# Patient Record
Sex: Female | Born: 1980 | Hispanic: Yes | Marital: Single | Smoking: Never smoker
Health system: Southern US, Community
[De-identification: ages and names within clinical notes are randomized; demographics above are authoritative.]

## PROBLEM LIST (undated history)

## (undated) DIAGNOSIS — K219 Gastro-esophageal reflux disease without esophagitis: Secondary | ICD-10-CM

## (undated) DIAGNOSIS — D649 Anemia, unspecified: Secondary | ICD-10-CM

## (undated) DIAGNOSIS — B9681 Helicobacter pylori [H. pylori] as the cause of diseases classified elsewhere: Secondary | ICD-10-CM

## (undated) DIAGNOSIS — K279 Peptic ulcer, site unspecified, unspecified as acute or chronic, without hemorrhage or perforation: Secondary | ICD-10-CM

## (undated) HISTORY — DX: Peptic ulcer, site unspecified, unspecified as acute or chronic, without hemorrhage or perforation: K27.9

## (undated) HISTORY — PX: TUBAL LIGATION: SHX77

## (undated) HISTORY — DX: Helicobacter pylori (H. pylori) as the cause of diseases classified elsewhere: B96.81

## (undated) HISTORY — PX: UPPER GI ENDOSCOPY: SHX6162

## (undated) HISTORY — DX: Gastro-esophageal reflux disease without esophagitis: K21.9

## (undated) HISTORY — DX: Anemia, unspecified: D64.9

---

## 2014-02-18 ENCOUNTER — Other Ambulatory Visit (HOSPITAL_COMMUNITY)
Admission: RE | Admit: 2014-02-18 | Discharge: 2014-02-18 | Disposition: A | Discharge status: Home / Self Care | Payer: BLUE CROSS/BLUE SHIELD | Source: Ambulatory Visit | Attending: Gynecology | Admitting: Gynecology

## 2014-02-18 ENCOUNTER — Encounter: Payer: Self-pay | Admitting: Gynecology

## 2014-02-18 ENCOUNTER — Ambulatory Visit (INDEPENDENT_AMBULATORY_CARE_PROVIDER_SITE_OTHER): Payer: BLUE CROSS/BLUE SHIELD | Admitting: Gynecology

## 2014-02-18 VITALS — BP 122/78 | Ht 63.0 in | Wt 159.0 lb

## 2014-02-18 DIAGNOSIS — Z8619 Personal history of other infectious and parasitic diseases: Secondary | ICD-10-CM

## 2014-02-18 DIAGNOSIS — Z1151 Encounter for screening for human papillomavirus (HPV): Secondary | ICD-10-CM | POA: Insufficient documentation

## 2014-02-18 DIAGNOSIS — Z01419 Encounter for gynecological examination (general) (routine) without abnormal findings: Secondary | ICD-10-CM | POA: Insufficient documentation

## 2014-02-18 DIAGNOSIS — K219 Gastro-esophageal reflux disease without esophagitis: Secondary | ICD-10-CM

## 2014-02-18 LAB — CBC WITH DIFFERENTIAL/PLATELET
Basophils Absolute: 0 10*3/uL (ref 0.0–0.1)
Basophils Relative: 1 % (ref 0–1)
EOS ABS: 0 10*3/uL (ref 0.0–0.7)
Eosinophils Relative: 1 % (ref 0–5)
HCT: 38.1 % (ref 36.0–46.0)
Hemoglobin: 12.4 g/dL (ref 12.0–15.0)
LYMPHS ABS: 2.3 10*3/uL (ref 0.7–4.0)
Lymphocytes Relative: 47 % — ABNORMAL HIGH (ref 12–46)
MCH: 26.2 pg (ref 26.0–34.0)
MCHC: 32.5 g/dL (ref 30.0–36.0)
MCV: 80.5 fL (ref 78.0–100.0)
MONO ABS: 0.4 10*3/uL (ref 0.1–1.0)
MONOS PCT: 9 % (ref 3–12)
MPV: 10.2 fL (ref 8.6–12.4)
NEUTROS ABS: 2 10*3/uL (ref 1.7–7.7)
NEUTROS PCT: 42 % — AB (ref 43–77)
PLATELETS: 242 10*3/uL (ref 150–400)
RBC: 4.73 MIL/uL (ref 3.87–5.11)
RDW: 13.9 % (ref 11.5–15.5)
WBC: 4.8 10*3/uL (ref 4.0–10.5)

## 2014-02-18 NOTE — Progress Notes (Signed)
Pamela Gibbs 22-Apr-1980 [id number]   History:    34 y.o.  for annual gyn exam who is a new patient to the practice. Patient stated that she delivered 6 months ago at [facility] vaginally at term and before discharge had postpartum tubal ligation. She is not a gravida 3 para 3. She breast-fed for 3 months in the last 3 months her cycles of been regular. Patient was otherwise asymptomatic but as much as I can get from her history she had positive GBS at time of her pregnancy and received antibiotic and also prior to her pregnancy she had been evaluated and appears she may have had H. pylori. She had been given medication for GERD but is asymptomatic and no longer taking it. Patient denies any prior history of any abnormal Pap smears. Patient is from [place], [place].  Past medical history,surgical history, family history and social history were all reviewed and documented in the EPIC chart.  Gynecologic History Patient's last menstrual period was 02/05/2014. Contraception: tubal ligation Last Pap: ?Marland Kitchen Results were: normal Last mammogram: Not indicated. Results were: Not indicated  Obstetric History OB History  Gravida Para Term Preterm AB SAB TAB Ectopic Multiple Living  # Outcome Date GA Lbr Len/2nd Weight Sex Delivery Anes PTL Lv  3 Para           2 Para           1 Para                ROS: A ROS was performed and pertinent positives and negatives are included in the history.  GENERAL: No fevers or chills. HEENT: No change in vision, no earache, sore throat or sinus congestion. NECK: No pain or stiffness. CARDIOVASCULAR: No chest pain or pressure. No palpitations. PULMONARY: No shortness of breath, cough or wheeze. GASTROINTESTINAL: No abdominal pain, nausea, vomiting or diarrhea, melena or bright red blood per rectum. GENITOURINARY: No urinary frequency, urgency, hesitancy or dysuria. MUSCULOSKELETAL: No joint or muscle pain, no back pain, no recent trauma.  DERMATOLOGIC: No rash, no itching, no lesions. ENDOCRINE: No polyuria, polydipsia, no heat or cold intolerance. No recent change in weight. HEMATOLOGICAL: No anemia or easy bruising or bleeding. NEUROLOGIC: No headache, seizures, numbness, tingling or weakness. PSYCHIATRIC: No depression, no loss of interest in normal activity or change in sleep pattern.     Exam: chaperone present  BP 122/78 mmHg  Ht  (1.6 m)  Wt 159 lb (72.122 kg)  BMI 28.17 kg/m2  LMP 02/05/2014  Body mass index is 28.17 kg/(m^2).  General appearance : Well developed well nourished female. No acute distress HEENT: Neck supple, trachea midline, no carotid bruits, no thyroidmegaly Lungs: Clear to auscultation, no rhonchi or wheezes, or rib retractions  Heart: Regular rate and rhythm, no murmurs or gallops Breast:Examined in sitting and supine position were symmetrical in appearance, no palpable masses or tenderness,  no skin retraction, no nipple inversion, no nipple discharge, no skin discoloration, no axillary or supraclavicular lymphadenopathy Abdomen: no palpable masses or tenderness, no rebound or guarding Extremities: no edema or skin discoloration or tenderness  Pelvic:  Bartholin, Urethra, Skene Glands: Within normal limits             Vagina: No gross lesions or discharge  Cervix: No gross lesions or discharge  Uterus  anteverted, normal size, shape and consistency, non-tender and mobile  Adnexa  Without masses  or tenderness  Anus and perineum  normal   Rectovaginal  normal sphincter tone without palpated masses or tenderness             Hemoccult not indicated     Assessment/Plan:  34 y.o. female for annual exam who did not show up for her six-week postpartum visit after [facility] [place]. Pap smear was done today. The following labs were ordered: CBC, conference metabolic panel, screening cholesterol, and urinalysis. Patient was reminded  on the importance of monthly breast exam. Patient stated that she had her flu vaccine within the past year.   Ok EdwardsFERNANDEZ,Ardith Test H MD, 2:54 PM 02/18/2014

## 2014-02-19 LAB — URINALYSIS W MICROSCOPIC + REFLEX CULTURE
Bacteria, UA: NONE SEEN
Bilirubin Urine: NEGATIVE
CRYSTALS: NONE SEEN
Casts: NONE SEEN
GLUCOSE, UA: NEGATIVE mg/dL
HGB URINE DIPSTICK: NEGATIVE
Ketones, ur: NEGATIVE mg/dL
Leukocytes, UA: NEGATIVE
Nitrite: NEGATIVE
PROTEIN: NEGATIVE mg/dL
Specific Gravity, Urine: 1.021 (ref 1.005–1.030)
Urobilinogen, UA: 0.2 mg/dL (ref 0.0–1.0)
pH: 5.5 (ref 5.0–8.0)

## 2014-02-19 LAB — CHOLESTEROL, TOTAL: Cholesterol: 201 mg/dL — ABNORMAL HIGH (ref 0–200)

## 2014-02-19 LAB — COMPREHENSIVE METABOLIC PANEL WITH GFR
ALT: 11 U/L (ref 0–35)
AST: 14 U/L (ref 0–37)
Albumin: 4 g/dL (ref 3.5–5.2)
Alkaline Phosphatase: 80 U/L (ref 39–117)
BUN: 8 mg/dL (ref 6–23)
CO2: 25 meq/L (ref 19–32)
Calcium: 9.1 mg/dL (ref 8.4–10.5)
Chloride: 103 meq/L (ref 96–112)
Creat: 0.41 mg/dL — ABNORMAL LOW (ref 0.50–1.10)
Glucose, Bld: 89 mg/dL (ref 70–99)
Potassium: 3.8 meq/L (ref 3.5–5.3)
Sodium: 138 meq/L (ref 135–145)
Total Bilirubin: 0.6 mg/dL (ref 0.2–1.2)
Total Protein: 6.8 g/dL (ref 6.0–8.3)

## 2014-02-22 LAB — CYTOLOGY - PAP

## 2014-03-03 ENCOUNTER — Telehealth: Payer: Self-pay | Admitting: *Deleted

## 2014-03-03 NOTE — Telephone Encounter (Signed)
Please reassure patient that these are normal variant reference markers. No additional testing is required

## 2014-03-03 NOTE — Telephone Encounter (Signed)
Pamela Gibbs will inform patient. 

## 2014-03-03 NOTE — Telephone Encounter (Signed)
Pt saw lab results on my chart and called Claudia with questions about the abnormal (yellow flags) on her results. Pt asked if she should be worried about anything? Please advise

## 2014-07-22 ENCOUNTER — Encounter: Payer: Self-pay | Admitting: Gynecology

## 2014-07-22 ENCOUNTER — Ambulatory Visit (INDEPENDENT_AMBULATORY_CARE_PROVIDER_SITE_OTHER): Payer: BLUE CROSS/BLUE SHIELD | Admitting: Gynecology

## 2014-07-22 ENCOUNTER — Telehealth: Payer: Self-pay | Admitting: *Deleted

## 2014-07-22 VITALS — BP 122/82

## 2014-07-22 DIAGNOSIS — R1013 Epigastric pain: Secondary | ICD-10-CM

## 2014-07-22 DIAGNOSIS — Z8619 Personal history of other infectious and parasitic diseases: Secondary | ICD-10-CM | POA: Diagnosis not present

## 2014-07-22 LAB — CBC WITH DIFFERENTIAL/PLATELET
BASOS ABS: 0 10*3/uL (ref 0.0–0.1)
Basophils Relative: 0 % (ref 0–1)
EOS ABS: 0.1 10*3/uL (ref 0.0–0.7)
Eosinophils Relative: 2 % (ref 0–5)
HCT: 36.9 % (ref 36.0–46.0)
HEMOGLOBIN: 12.1 g/dL (ref 12.0–15.0)
LYMPHS ABS: 2.9 10*3/uL (ref 0.7–4.0)
Lymphocytes Relative: 49 % — ABNORMAL HIGH (ref 12–46)
MCH: 25.5 pg — ABNORMAL LOW (ref 26.0–34.0)
MCHC: 32.8 g/dL (ref 30.0–36.0)
MCV: 77.8 fL — ABNORMAL LOW (ref 78.0–100.0)
MONOS PCT: 10 % (ref 3–12)
MPV: 10.2 fL (ref 8.6–12.4)
Monocytes Absolute: 0.6 10*3/uL (ref 0.1–1.0)
NEUTROS ABS: 2.3 10*3/uL (ref 1.7–7.7)
NEUTROS PCT: 39 % — AB (ref 43–77)
Platelets: 263 10*3/uL (ref 150–400)
RBC: 4.74 MIL/uL (ref 3.87–5.11)
RDW: 15.3 % (ref 11.5–15.5)
WBC: 5.9 10*3/uL (ref 4.0–10.5)

## 2014-07-22 LAB — COMPREHENSIVE METABOLIC PANEL
ALBUMIN: 4 g/dL (ref 3.5–5.2)
ALK PHOS: 75 U/L (ref 39–117)
ALT: 12 U/L (ref 0–35)
AST: 14 U/L (ref 0–37)
BUN: 11 mg/dL (ref 6–23)
CO2: 25 meq/L (ref 19–32)
Calcium: 9.1 mg/dL (ref 8.4–10.5)
Chloride: 102 mEq/L (ref 96–112)
Creat: 0.73 mg/dL (ref 0.50–1.10)
Glucose, Bld: 82 mg/dL (ref 70–99)
POTASSIUM: 3.8 meq/L (ref 3.5–5.3)
SODIUM: 136 meq/L (ref 135–145)
TOTAL PROTEIN: 6.9 g/dL (ref 6.0–8.3)
Total Bilirubin: 0.5 mg/dL (ref 0.2–1.2)

## 2014-07-22 NOTE — Telephone Encounter (Signed)
Appointment on 07/29/14 @ 2:00pm at [facility] hospital, NPO 6 hours prior to ultrasound. Debarah Crape will relay to patient

## 2014-07-22 NOTE — Telephone Encounter (Signed)
-----   Message from Ok Edwards, MD sent at 07/22/2014  2:57 PM EDT ----- Victorino Dike, please schedule this patient for an upper abdominal ultrasound. Patient with midepigastric pains rule out cholelithiasis. History of H. pylori in the past.

## 2014-07-22 NOTE — Patient Instructions (Signed)
lcera pptica  (Peptic Ulcer)  La lcera pptica es una llaga dolorosa en la membrana que recubre el esfago (lcera esofgica), el estmago (lcera gstrica), o la primera parte del intestino delgado (lcera duodenal). La lcera causa erosin en los tejidos profundos.  CAUSAS  Normalmente, el revestimiento del estmago y del intestino delgado se protegen a s mismos del cido con que se digieren los alimentos. El revestimiento protector puede daarse debido a:   Una infeccin causada por una bacteria llamada Helicobacter pylori (H. pylori).  El uso regular de medicamentos anti-inflamatorios no esteroides (AINE), como el ibuprofeno o la aspirina.  El consumo de tabaco. Otros factores de riesgo incluyen ser mayor de 50 aos, el consumo de alcohol en exceso y Wilburt Finlay antecedentes familiares de lcera.  SNTOMAS   Dolor quemante o punzante en la zona entre el pecho y el ombligo.  Acidez.  Nuseas y vmitos.  Hinchazn. El dolor empeora con el estmago vaco y por la noche. Si la lcera sangra, puede causar:   Materia fecal de color negro alquitranado.  Vmito de sangre roja brillante.  Vmito de aspecto similar a la borra del caf. DIAGNSTICO  El diagnstico se realiza basndose en la historia clnica y el examen fsico. Para encontrar las causas de la lcera podrn indicarle otros estudios y procedimientos. Encontrar la causa ayudar a Futures trader. Los estudios y procedimientos pueden incluir:   Anlisis de sangre, anlisis de materia fecal, o estudios del aliento para Engineer, manufacturing la bacteria H. pylori.  Una seriada del tracto gastrointestinal (GI) del esfago, el estmago y el intestino delgado.  Una endoscopia para examinar el esfago, el estmago y el intestino delgado.  Una biopsia. TRATAMIENTO  El tratamiento incluye:   La eliminacin de la causa de la lcera, como el tabaquismo, los [place] o el alcohol.  Medicamentos para reducir la cantidad de cido en  el tracto digestivo.  Antibiticos si la causa de la lcera es la bacteria H. pylori.  Una endoscopia superior para tratar Rolan Lipa sangrante.  Ciruga si el sangrado es grave o si la lcera ha perforado Event organiser en el sistema digestivo. INSTRUCCIONES PARA EL CUIDADO EN EL HOGAR   Evite el tabaco, el alcohol y la cafena. El fumar puede aumentar el cido en el estmago y el tabaquismo continuado no favorecer la curacin de las lceras.  Evite los alimentos y las bebidas que le parece que le causan molestias o que le agravan su lcera.  Tome slo la medicacin que le indic el profesional. No tome sustitutos de venta libre de los medicamentos recetados sin Science writer a su mdico.  Cumpla con las consultas de control y hgase los estudios segn las indicaciones. SOLICITE ATENCIN MDICA SI:   La infeccin no mejora dentro de los [address]  [address] despus de Programmer, systems.  Siente indigestin o [place] continua. SOLICITE ATENCIN MDICA DE INMEDIATO SI:   Siente un dolor repentino y agudo o persistente en el abdomen.  La materia fecal es sanguinolenta o negra, de aspecto alquitranado.  Vomita sangre o el vmito tiene el aspecto similar a la borra del caf.  Si se siente mareado, dbil o que va a desmayarse.  Se siente transpirado o sudoroso. ASEGRESE DE QUE:   Comprende estas instrucciones.  Controlar su enfermedad.  Solicitar ayuda de inmediato si no mejora o si empeora. Document Released: 11/01/2004 Document Revised: 10/17/2011 [facility] Patient Information 2015 [place], [place]. This information is not intended to replace advice given to you by  your health care provider. Make sure you discuss any questions you have with your health care provider. Anlisis de anticuerpos a Helicobacter Pylori (Helicobacter Pylori Antibodies Test) Se trata de un anlisis de sangre que busca la presencia de una bacteria llamada Helicobacter Pylori. Esto puede diagnosticarse  tambin mediante una prueba de [place] o un examen microscpico de una parte (biopsia) del intestino delgado. H. pylori es una bacteria que se encuentra en las clulas que recubren el [place]. Es un factor de riesgo para las Educational psychologist y en el intestino delgado, inflamacin prolongada del recubrimiento del [place], o incluso lceras que pueden ocurrir en el esfago (la va que va desde la boca al [place]). Esta bacteria es tambin un factor de riesgo de cncer de estmago. La bacteria que se encuentra en, aproximadamente, el 10% de las personas sanas menores de 30 aos de edad, y la cantidad de bacteria aumenta con la edad. La Harley[employer]Davidson de las personas que tienen esta bacteria no experimentan sntomas; no obstante, se considera que, cuando esta bacteria ocasiona lceras, se pueden usar antibiticos para eliminar o reducir el problema.  PREPARACIN PARA EL ESTUDIO No se requiere una preparacin especial ni ayunar. HALLAZGOS NORMALES Negativo (no se detect la presencia de la bacteria H-pylori) Los rangos para los resultados normales pueden variar entre diferentes laboratorios y hospitales. Consulte siempre con su mdico despus de Production assistant, radio estudio para Artist significado de los [place] y si los valores se consideran "dentro de los lmites normales". SIGNIFICADO DEL ESTUDIO  El mdico leer los resultados y Heritage manager con usted sobre la importancia y el significado de los [place], as como las opciones de tratamiento y la necesidad de Education officer, environmental pruebas adicionales, si fuera necesario. Es su responsabilidad retirar el resultado del [place]. Consulte en el laboratorio cundo y cmo podr [employer]. OBTENCIN DE LOS RESULTADOS DE LAS PRUEBAS Es su responsabilidad retirar el resultado del [place]. Consulte en el laboratorio cundo y cmo podr [employer]. Document Released: 11/12/2012 [facility] Patient Information 2015 [place], [place]. This information is not  intended to replace advice given to you by your health care provider. Make sure you discuss any questions you have with your health care provider. Colelitiasis (Cholelithiasis) La colelitiasis (tambin llamada clculos en la vescula) es una enfermedad de la vescula. La vescula es un rgano pequeo que ayuda a digerir las [place]. Los sntomas de clculos en la vescula son:  Caleen Essex de vomitar (nuseas).  Devolver la comida (vomitar).  Dolor en el vientre.  Piel amarilla (ictericia)  Dolor sbito. Podr sentir Chief Technology Officer durante algunos minutos o unas horas.  Grant Ruts.  Dolor a Insurance claims handler. CUIDADOS EN EL HOGAR  Tome slo los medicamentos segn le haya indicado el mdico.  Siga una dieta baja en grasas hasta que vuelva a ver al mdico nuevamente. Consumir grasas puede Programmer, multimedia.  Concurra a las consultas de control con el mdico, segn las indicaciones. Los ataques generalmente ocurren repetidas veces. Generalmente se requiere de [employer]. SOLICITE AYUDA DE INMEDIATO SI:   El dolor empeora.  El dolor no se alivia con los [employer].  Tiene fiebre o sntomas que persisten durante ms de 2-3 das.  Tiene fiebre y los sntomas empeoran repentinamente.  Siente nuseas y vomita. ASEGRESE DE QUE:   Comprende estas instrucciones.  Controlar su afeccin.  Recibir ayuda de inmediato si no mejora o si empeora. Document Released: 04/20/2008 Document Revised: 09/24/2012 [facility] Patient Information 2015 [place], [place]. This information is  not intended to replace advice given to you by your health care provider. Make sure you discuss any questions you have with your health care provider.

## 2014-07-22 NOTE — Progress Notes (Signed)
   Patient is a 34 year old who was seen in the office was the first time as a new patient J your this year. Patient had a vaginal delivery in 2015 at [facility] and subsequently had postpartum tubal ligation. She reports normal menstrual cycles. The reason for her presentation to the office is that she is experiencing right upper quadrant discomfort for the past month. She feels bloated. Also when she eats food her symptoms return especially if they're high in fat intake. There is a question whether she was treated with an upper endoscopy and with been diagnosed with H. pylori last year. There is description that she has had history of GERD in the past. Patient denies any change in her stool color or urine. Or any yellowing of her eyes.  Exam: Blood pressure 122/82, sclera with no evidence of icterus  Gen. appearance well-developed well-nourished female in no acute distress Abdomen tenderness in the mid epigastric portion/positive Murphy sign Pelvic: Bartholin urethra Skene was within normal limits vagina: No lesions or discharge Cervix: No lesions or discharge Uterus anteverted normal size shape and consistency Adnexa: No palpable mass or tenderness Rectal exam not done  Assessment/plan: Patient with right upper quadrant pains manifested by foods especially high in fact confident. Highly suspicious of cholelithiasis. Patient will be scheduled for an upper abdominal ultrasound with the next few days. A comprehensive metabolic panel along with CBC. Was ordered also in H. pylori breath test to see if she's having an exacerbation of her H. pylori based on her history and symptomatology. Depending on these tests we'll refer to the gastroenterologist and she has seen in the past uterine [place] or refer to her local gastroenterologist. Instructions provided in written format in Spanish. Interpreter present as well.

## 2014-07-22 NOTE — Addendum Note (Signed)
Addended by: Richardson Chiquito on: 07/22/2014 03:37 PM   Modules accepted: Orders

## 2014-07-23 ENCOUNTER — Telehealth: Payer: Self-pay

## 2014-07-23 ENCOUNTER — Encounter: Payer: Self-pay | Admitting: Gynecology

## 2014-07-23 ENCOUNTER — Other Ambulatory Visit: Payer: Self-pay | Admitting: Gynecology

## 2014-07-23 LAB — H. PYLORI BREATH TEST: H. pylori Breath Test: DETECTED — AB

## 2014-07-23 MED ORDER — TETRACYCLINE HCL 500 MG PO CAPS: 500.0000 mg | ORAL_CAPSULE | Freq: Four times a day (QID) | ORAL | Status: DC

## 2014-07-23 MED ORDER — METRONIDAZOLE 250 MG PO TABS: 250.0000 mg | ORAL_TABLET | Freq: Four times a day (QID) | ORAL | Status: DC

## 2014-07-23 MED ORDER — BISMUTH SUBSALICYLATE 262 MG PO TABS: 524.0000 mg | ORAL_TABLET | Freq: Four times a day (QID) | ORAL | Status: DC

## 2014-07-23 MED ORDER — OMEPRAZOLE 20 MG PO CPDR: 20.0000 mg | DELAYED_RELEASE_CAPSULE | Freq: Two times a day (BID) | ORAL | Status: DC

## 2014-07-23 NOTE — Telephone Encounter (Signed)
Patient called regarding lab results from yesterday.  Advised blood work results are okay and H. Pylori test is pending.  It did appear you had reviewed the labs. Ok to have told her that they looked okay?

## 2014-07-23 NOTE — Telephone Encounter (Signed)
Pamela Gibbs spoke with patient and informed her of Labs and H. Pylori results and needs for medications. I sent Rx's for all four meds to her pharmacy.

## 2014-07-23 NOTE — Telephone Encounter (Signed)
That is correct H. pylori an upper abdominal ultrasound results I do not have

## 2014-07-26 ENCOUNTER — Telehealth: Payer: Self-pay | Admitting: Anesthesiology

## 2014-07-26 ENCOUNTER — Telehealth: Payer: Self-pay | Admitting: *Deleted

## 2014-07-26 NOTE — Telephone Encounter (Signed)
It is probably from medication. Make sure that she follows up with her gastroenterologist that she has seen before after she completes the treatment

## 2014-07-26 NOTE — Telephone Encounter (Signed)
SEE note from [place] same date

## 2014-07-26 NOTE — Telephone Encounter (Signed)
Pamela Gibbs spoke with patient and asked me to relay to you this. pt [name] test was positive is taking prescribed medication, states pt has noticed black like substance when using the bathroom? Please advise

## 2014-07-27 NOTE — Telephone Encounter (Signed)
Will relay to [place]

## 2014-07-29 ENCOUNTER — Ambulatory Visit (HOSPITAL_COMMUNITY)
Admission: RE | Admit: 2014-07-29 | Discharge: 2014-07-29 | Disposition: A | Discharge status: Home / Self Care | Payer: BLUE CROSS/BLUE SHIELD | Source: Ambulatory Visit | Attending: Gynecology | Admitting: Gynecology

## 2014-07-29 DIAGNOSIS — K7689 Other specified diseases of liver: Secondary | ICD-10-CM | POA: Insufficient documentation

## 2014-07-29 DIAGNOSIS — I77811 Abdominal aortic ectasia: Secondary | ICD-10-CM | POA: Diagnosis not present

## 2014-07-29 DIAGNOSIS — R1013 Epigastric pain: Secondary | ICD-10-CM | POA: Diagnosis present

## 2014-08-02 ENCOUNTER — Telehealth: Payer: Self-pay | Admitting: *Deleted

## 2014-08-02 NOTE — Telephone Encounter (Signed)
Referral form and notes faxed to [facility] [place] they will contact pt to schedule.

## 2014-08-02 NOTE — Telephone Encounter (Signed)
-----   Message from Keenan BachelorKatherine R Annas, [place]MA sent at 07/30/2014 11:48 AM EDT ----- Regarding: referral Victorino DikeJennifer please inform patient that her abdominal ultrasound was normal with the exception that her Aorta is slightly dilated and needs further evaluation. Please make an appointment with anyone in the [facility] that Dr. Tawanna Coolerodd Early is in. 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 She speaks no [place]. Claudia or [place]comb[place]anca may need to helpmyou

## 2014-08-03 NOTE — Telephone Encounter (Signed)
Appointment on 09/14/14 @ 10:00 am with Dr.Todd Early will have blance relay to patient.

## 2014-08-04 ENCOUNTER — Encounter: Payer: Self-pay | Admitting: Vascular Surgery

## 2014-09-14 ENCOUNTER — Encounter: Payer: BLUE CROSS/BLUE SHIELD | Admitting: Vascular Surgery

## 2014-09-20 ENCOUNTER — Encounter: Payer: Self-pay | Admitting: Vascular Surgery

## 2014-09-21 ENCOUNTER — Encounter: Payer: BLUE CROSS/BLUE SHIELD | Admitting: Vascular Surgery

## 2014-09-23 ENCOUNTER — Other Ambulatory Visit: Payer: Self-pay | Admitting: Gynecology

## 2014-12-01 ENCOUNTER — Encounter: Payer: Self-pay | Admitting: Gynecology

## 2014-12-01 ENCOUNTER — Ambulatory Visit (INDEPENDENT_AMBULATORY_CARE_PROVIDER_SITE_OTHER): Payer: BLUE CROSS/BLUE SHIELD | Admitting: Gynecology

## 2014-12-01 VITALS — BP 126/78

## 2014-12-01 DIAGNOSIS — N819 Female genital prolapse, unspecified: Secondary | ICD-10-CM

## 2014-12-01 DIAGNOSIS — R102 Pelvic and perineal pain: Secondary | ICD-10-CM

## 2014-12-01 DIAGNOSIS — Z8619 Personal history of other infectious and parasitic diseases: Secondary | ICD-10-CM | POA: Diagnosis not present

## 2014-12-01 NOTE — Progress Notes (Signed)
   Patient is a 34 year ol69d gravida 3 para 3 that presented to the office today complaining of a bulging sensation at her vagina. She denies any true urinary incontinence but if she has to urinate she has to rush to the bathroom. She denies any fever, chills, nausea, or vomiting. She denies any GI problems. Patient states that her 3 children were delivered vaginally largest one measuring 9 pounds. The past 3 months her symptoms became more evident since she has been doing a lot of lifting at work. In June of this year due to patient's midepigastric pains and H. pylori breath test was done which was positive. She was treated accordingly and had been referred to the gastroenterologist. She had been on metronidazole, omeprazole result, and tetracycline as part of her treatment regimen. She brought a copy of the report from [place] August of this year and the pathology report was that the lining of the stomach only inflammation no signs of bacterial infection. She was instructed to refrain from NSAIDs.  Exam: Well developed well nourished female with above-mentioned complaint Patient was examined in the supine and erect position. Pelvic: Bartholin urethra Skene was within normal limits Vagina: No cystocele rectocele noted on Valsalva maneuver her cervix came to the area of the introitus. A Q-tip angle test was less than 30 at the urethrovesical angle.  Patient was examined and erect position uterine descensus was noted whereby the cervix was close to the opening of the vagina. There was no cystocele rectocele patient did not allow me to do a rectal exam to rule out any enterocele.  Assessment/plan: Patient with pelvic organ prolapse no cystocele, rectocele, no evidence of stress urinary incontinence. Patient was shown pictures and pamphlets provided in reference to pelvic organ prolapse. We discussed treatment options to include transvaginal hysterectomy or  temporary relief with pessary. Literature information was also presented in Spanish patient will review it and decide which route she would like to proceed and return back to see me in 2 weeks.

## 2014-12-01 NOTE — Patient Instructions (Signed)
Informacin sobre Training and development officer (Hysterectomy Information)  La histerectoma es una ciruga que se realiza para extirpar el tero. Esta ciruga se puede realizar para tratar varios problemas mdicos. Despus de la ciruga, no volver a Personal assistant. Adems, debido a esta ciruga no podr quedar embarazada (estril). Asimismo, durante esta ciruga se podrn extirpar las trompas de falopio y los ovarios (salpingooforectoma bilateral).  RAZONES PARA REALIZAR UNA HISTERECTOMA  Sangrado persistente, anormal.  Dolor o infeccin persistente (crnico) en la pelvis.  El endometrio comienza a desarrollarse fuera del tero (endometriosis).  El endometrio comienza a desarrollarse en el msculo del tero (adenomiosis).  El tero desciende hacia la vagina (prolapso de los rganos de la pelvis).  Neoplasias benignas en el tero (fibromas uterinos) que provocan sntomas.  Clulas precancerosas.  Cncer cervical o cncer uterino. TIPOS DE HISTERECTOMA  Histerectoma supracervical: en este tipo de intervencin, se extirpa la parte superior del tero, pero no el cuello del tero.  Histerectoma total: se extirpan el tero y el cuello del tero.  Histerectoma radical: se extirpan el tero, el cuello del tero y los tejidos fibrosos que sostienen al tero en su lugar en la pelvis (parametrio). FORMAS EN QUE SE PUEDE REALIZAR UNA HISTERECTOMA  Histerectoma abdominal: se realiza un corte quirrgico grande (incisin) en el abdomen. Se extirpa el tero a travs de esta incisin.  Histerectoma vaginal: se realiza una incisin en la vagina. Se extirpa el tero a travs de esta incisin. No hay incisiones abdominales.  Histerectoma laparoscpica convencional: se realizan tres o cuatro incisiones pequeas en el abdomen. Se inserta un tubo delgado y luminoso con una cmara (laparoscopio) en una de las incisiones. A travs del resto de las incisiones se insertan otros instrumentos  quirrgicos. El tero se corta en trozos pequeos. Las piezas pequeas se eliminan a travs de las incisiones, o se retiran a travs de la vagina.  Histerectoma vaginal asistida por laparoscopia (HVAL): se realizan tres o cuatro incisiones pequeas en el abdomen. Parte de la ciruga se realiza por va laparoscpica y parte por va vaginal. El tero se extirpa a travs de la vagina.  Histerectoma laparoscpica asistida por robot: se insertan un laparoscopio y otros instrumentos quirrgicos en 3 o 4 incisiones pequeas en el abdomen. Se utiliza un dispositivo controlado por computadora para que el cirujano vea una imagen en 3D que lo ayuda a Chief Technology Officer los instrumentos quirrgicos. Esto permite movimientos ms precisos de los instrumentos quirrgicos. El tero se corta en trozos pequeos y se retira a travs de las incisiones o se elimina a travs de la vagina. RIESGOS Y [place] complicaciones asociadas con este procedimiento incluyen las siguientes:  Hemorragia y riesgos de Designer, industrial/product una transfusin sangunea. Infrmele al mdico si no quiere recibir ningn tipo de hemoderivados.  Cogulos de [employer] piernas o los pulmones.  Infeccin.  Lesin en los rganos circundantes.  Problemas o efectos secundarios relacionados con la anestesia.  Transformacin de cualquiera de las otras tcnicas en una histerectoma abdominal. QU ESPERAR DESPUES DE UNA HISTERECTOMA  Le darn medicamentos para el dolor.  Necesitar que alguien Nature conservation officer con usted durante los primeros 3 a 5 das despus de que regrese a Medical illustrator.  Tendr que ver al cirujano para un seguimiento 2 a 4 semanas despus de la ciruga para evaluar su progreso.  Posiblemente tenga sntomas tempranos de menopausia, como sofocos, sudoracin nocturna e insomnio.  Si le han realizado una histerectoma debido a un problema que no era cncer ni Ardelia Mems  afeccin que poda causar cncer, ya no necesitar una prueba de  Papanicolaou. Sin embargo, si ya no necesita hacerse un Papanicolau, es una buena idea hacerse un examen regularmente para asegurarse de que no hay otros problemas.   Esta informacin no tiene Theme park managercomo fin reemplazar el consejo del mdico. Asegrese de hacerle al mdico cualquier pregunta que tenga.   Document Released: 01/22/2005 Document Revised: 11/12/2012 Elsevier Interactive Patient Education 2016 [employer] Prolapso de los rganos de la pelvis (Pelvic Organ Prolapse) El prolapso de los rganos de la pelvis es el estiramiento, la dilatacin o la cada de los rganos de la pelvis a una posicin anormal. Esto sucede cuando los msculos y tejidos que rodean y sostienen las estructuras plvicas se estiran o se debilitan. El prolapso de los rganos de la pelvis puede involucrar los siguientes rganos:  Vagina (prolapso vaginal).  tero (prolapso uterino).  Vejiga (cistocele).  Recto (rectocele).  Intestinos (enterocele). Cuando estn comprometidos rganos que no son la vagina, estos a menudo se protruyen hacia adentro o hacia afuera de la vagina, dependiendo de la gravedad del prolapso. CAUSAS Algunas causas de esta enfermedad incluyen lo siguiente:  Krystal Clarkmbarazo, trabajo de parto y [place].  Tos prolongada (crnica).  Estreimiento crnico.  Obesidad.  Ciruga de pelvis anterior.  Envejecimiento. Durante y despus de la menopausia, una disminucin en la produccin de Cabin crewestrgenos puede debilitar los msculos y los ligamentos plvicos.  Levantar sistemticamente objetos pesados de ms de 50libras (23kg).  Acumulacin de lquido en el abdomen debido a ciertas enfermedades y a Lexicographerotras afecciones. SNTOMAS Los sntomas de esta afeccin incluyen lo siguiente:  Prdida del control de la vejiga al toser, Engineering geologistestornudar, hacer un esfuerzo y Radio producerhacer ejercicio (incontinencia urinaria de esfuerzo). Esto puede empeorar inmediatamente despus del nacimiento y Scientist, clinical (histocompatibility and immunogenetics)mejorar gradualmente con Diplomatic Services operational officerel  tiempo.  Sensacin de presin en la pelvis o la vagina. Esta presin puede aumentar al toser o defecar.  Una protuberancia que sobresale desde la apertura de la vagina o contra la pared vaginal. Si el tero sobresale a travs de la apertura de la vagina y roza la ropa, tambin puede padecer irritacin, lceras, infeccin, dolor y Sipseysangrado.  Mayor esfuerzo para Magazine features editordefecar u orinar.  Dolor en la parte inferior de la espalda.  Dolor, molestias o Diplomatic Services operational officerdesinters en las relaciones sexuales.  Infecciones reiteradas en la vejiga (infecciones en las vas urinarias).  Dificultad o incapacidad para colocar un tampn o un aplicador. En algunas personas, esta afeccin no produce sntomas. DIAGNSTICO El mdico puede realizarle un examen interno y externo de la vagina y el recto. Durante el examen, puede pedirle que tosa y que haga un esfuerzo mientras est [place]enzburgacostada, sentada y de pie. El mdico determinar si se requieren ms estudios, como las pruebas de la funcin de la vejiga. TRATAMIENTO En la International Business Machinesmayora de los casos, esta afeccin debe tratarse solo si produce sntomas. No existe ningn tratamiento que garantice que el prolapso se corregir o que alivie los sntomas por completo. El tratamiento puede incluir lo siguiente:  Cambios de estilo de vida tales como:  Automotive engineervitar las bebidas que contengan cafena.  Aumentar la ingesta de alimentos con alto contenido de [place]fibra. Esto puede ayudar a Engineer, manufacturing systemsdisminuir el estreimiento y Psychologist, educationalel esfuerzo al defecar.  Vaciar la vejiga en momentos programados (terapia de entrenamiento de la vejiga). Esto puede ayudar a reducir o Transport plannerevitar la incontinencia urinaria.  Bajar de peso si tiene sobrepeso o es obeso.  Estrgeno. Si el prolapso es leve, el estrgeno puede ayudar al aumentar la fuerza  y el tono de los msculos del piso plvico.  Ejercicios de Kegel. Estos ejercicios pueden ayudar en los casos leves de prolapso al estirar y tensar los msculos del piso plvico.  Colocacin de un  pesario. Un pesario es un dispositivo blando y flexible que el mdico coloca en la vagina para ayudar a Occupational psychologist las paredes vaginales y Pharmacologist los rganos de la pelvis en su Environmental consultant.  Ciruga. Esta es a menudo la nica forma de tratamiento de los casos graves de prolapso. Existen diferentes tipos de [place] disponibles. INSTRUCCIONES PARA EL CUIDADO EN EL HOGAR  Use una toalla higinica o un producto absorbente si tiene incontinencia urinaria.  Evite levantar objetos pesados y Comptroller se ejercita o trabaja. No contenga la respiracin cuando levanta pesas y realiza ejercicios de intensidad leve a moderada. Limite sus actividades segn las indicaciones del mdico.  Tome los medicamentos solamente como se lo haya indicado el mdico.  Practique los ejercicios de Kegel como se lo haya indicado el mdico.  Si tiene un pesario, selo como se lo haya indicado el mdico. SOLICITE ATENCIN MDICA SI:  Sus sntomas interfieren con sus actividades diarias o su vida sexual.  Necesita medicamentos para Paramedic las [place].  Observa sangrado vaginal no relacionado con su menstruacin.  Tiene fiebre.  Siente dolor o tiene una hemorragia al [employer].  Observa sangrado al defecar.  Pierde orina durante las [employer].  Sufre estreimiento crnico.  Tiene un pesario colocado y Associate Professor.  Tiene secrecin vaginal con olor ftido.  Tiene dolor o clicos inusuales en la parte inferior del abdomen.   Esta informacin no tiene Theme park manager el consejo del mdico. Asegrese de hacerle al mdico cualquier pregunta que tenga.   Document Released: 08/19/2013 Elsevier Interactive Patient Education [employer]

## 2014-12-14 ENCOUNTER — Institutional Professional Consult (permissible substitution): Payer: BLUE CROSS/BLUE SHIELD | Admitting: Gynecology

## 2015-03-18 ENCOUNTER — Encounter: Payer: BLUE CROSS/BLUE SHIELD | Admitting: Gynecology

## 2016-04-11 ENCOUNTER — Encounter: Payer: Self-pay | Admitting: Gynecology

## 2016-04-11 ENCOUNTER — Ambulatory Visit (INDEPENDENT_AMBULATORY_CARE_PROVIDER_SITE_OTHER): Payer: BLUE CROSS/BLUE SHIELD | Admitting: Gynecology

## 2016-04-11 VITALS — BP 120/78 | Ht 63.0 in | Wt 150.0 lb

## 2016-04-11 DIAGNOSIS — Z01411 Encounter for gynecological examination (general) (routine) with abnormal findings: Secondary | ICD-10-CM

## 2016-04-11 DIAGNOSIS — N814 Uterovaginal prolapse, unspecified: Secondary | ICD-10-CM

## 2016-04-11 LAB — CBC WITH DIFFERENTIAL/PLATELET
Basophils Absolute: 0 cells/uL (ref 0–200)
Basophils Relative: 0 %
EOS PCT: 1 %
Eosinophils Absolute: 55 cells/uL (ref 15–500)
HCT: 35 % (ref 35.0–45.0)
Hemoglobin: 10.9 g/dL — ABNORMAL LOW (ref 11.7–15.5)
Lymphocytes Relative: 37 %
Lymphs Abs: 2035 cells/uL (ref 850–3900)
MCH: 23.6 pg — ABNORMAL LOW (ref 27.0–33.0)
MCHC: 31.1 g/dL — AB (ref 32.0–36.0)
MCV: 75.9 fL — ABNORMAL LOW (ref 80.0–100.0)
MPV: 10.8 fL (ref 7.5–12.5)
Monocytes Absolute: 550 cells/uL (ref 200–950)
Monocytes Relative: 10 %
NEUTROS PCT: 52 %
Neutro Abs: 2860 cells/uL (ref 1500–7800)
PLATELETS: 254 10*3/uL (ref 140–400)
RBC: 4.61 MIL/uL (ref 3.80–5.10)
RDW: 15.4 % — ABNORMAL HIGH (ref 11.0–15.0)
WBC: 5.5 10*3/uL (ref 3.8–10.8)

## 2016-04-11 LAB — COMPREHENSIVE METABOLIC PANEL
ALT: 9 U/L (ref 6–29)
AST: 16 U/L (ref 10–30)
Albumin: 4 g/dL (ref 3.6–5.1)
Alkaline Phosphatase: 72 U/L (ref 33–115)
BUN: 9 mg/dL (ref 7–25)
CO2: 22 mmol/L (ref 20–31)
Calcium: 9 mg/dL (ref 8.6–10.2)
Chloride: 103 mmol/L (ref 98–110)
Creat: 0.51 mg/dL (ref 0.50–1.10)
Glucose, Bld: 89 mg/dL (ref 65–99)
Potassium: 3.7 mmol/L (ref 3.5–5.3)
Sodium: 139 mmol/L (ref 135–146)
TOTAL PROTEIN: 7.1 g/dL (ref 6.1–8.1)
Total Bilirubin: 0.7 mg/dL (ref 0.2–1.2)

## 2016-04-11 LAB — CHOLESTEROL, TOTAL: CHOLESTEROL: 191 mg/dL (ref <200)

## 2016-04-11 MED ORDER — OMEPRAZOLE 20 MG PO CPDR: 20.0000 mg | DELAYED_RELEASE_CAPSULE | Freq: Every day | ORAL | 4 refills | Status: AC

## 2016-04-11 NOTE — Patient Instructions (Addendum)
Gammagrafa de reflujo gastroesofgico (Gastroesophageal Reflux Scan) La gammagrafa de reflujo gastroesofgico es un procedimiento que se realiza para Landscape architect presencia de reflujo gastroesofgico, que es el flujo retrgrado de los contenidos estomacales hacia el conducto que lleva los alimentos desde la boca hasta el estmago (esfago). Adems, puede revelar si se produce la inhalacin (aspiracin) de estos contenidos [employer]. Tal vez deba realizarse esta gammagrafa si tiene sntomas, como acidez, vmitos, problemas de deglucin o regurgitacin. La regurgitacin ocurre cuando los alimentos ingeridos regresan desde el estmago al esfago. Para este estudio, beber un lquido que contiene una pequea cantidad de una sustancia radiactiva Radiographer, therapeutic). Se [place] un escner con Neomia Dear cmara que detecta el marcador radiactivo para determinar si el material regresa al esfago. INFORME A SU MDICO:  Cualquier alergia que tenga.  Todos los [employer], incluidos vitaminas, hierbas, gotas oftlmicas, cremas y [address] de [address].  Enfermedades de la sangre que tenga.  Si tiene cirugas previas.  Si tiene [employer].  Si est embarazada o cree que podra estarlo.  Si est amamantando. RIESGOS Y COMPLICACIONES En general, se trata de un procedimiento seguro. Sin embargo, pueden presentarse problemas, por ejemplo:  Exposicin a la radiacin (pequea cantidad).  Reaccin alrgica a la sustancia radiactiva. Esto es raro. ANTES DEL PROCEDIMIENTO  Consulte a su mdico si debe cambiar o suspender los medicamentos que toma habitualmente. Esto es muy importante si toma medicamentos para la diabetes o anticoagulantes.  Siga las indicaciones del mdico respecto de las restricciones para las comidas o las bebidas. PROCEDIMIENTO  Se le indicar que beba un lquido que contiene una pequea cantidad de un marcador radiactivo. Es probable que este lquido sea parecido al  jugo de naranjas.  Se acostar boca arriba.  Le tomarn una serie de imgenes del esfago y de la parte superior del [place].  Tal vez le pidan que cambie de posicin para ayudar a Chief Strategy Officer si el reflujo es ms frecuente cuando adopta posiciones especficas.  Es posible que a los adultos se les coloque una faja abdominal con una almohadilla inflable sobre el vientre (abdomen), la cual puede usarse para aumentar la presin abdominal. Le tomarn ms imgenes para saber si se produce reflujo al aumentar la presin. Este procedimiento puede variar segn el mdico y el hospital. DESPUS DEL PROCEDIMIENTO  Reanude su dieta y sus actividades normales como se lo haya indicado el mdico.  El marcador radiactivo se eliminar del cuerpo luego de [place]. Beba suficiente lquido para Photographer orina clara o de color amarillo plido. Esto ayudar a Event organiser del cuerpo.  Es su responsabilidad retirar el resultado del [place]. Pregntele al mdico o consulte en el departamento donde se realice el estudio cundo y cmo obtendr los [place]. Esta informacin no tiene Theme park manager el consejo del mdico. Asegrese de hacerle al mdico cualquier pregunta que tenga. Document Released: 11/12/2012 Document Revised: 02/12/2014 Document Reviewed: 11/03/2013 Elsevier Interactive Patient Education  2017 [employer].   Prolapso de los rganos de la pelvis (Pelvic Organ Prolapse) El prolapso de los rganos de la pelvis es el estiramiento, la dilatacin o la cada de los rganos de la pelvis a una posicin anormal. Esto sucede cuando los msculos y tejidos que rodean y sostienen las estructuras plvicas se estiran o se debilitan. El prolapso de los rganos de la pelvis puede involucrar los siguientes rganos:  Vagina (prolapso vaginal).  tero (prolapso uterino).  Vejiga (cistocele).  Recto (rectocele).  Intestinos (enterocele). Cuando estn comprometidos  rganos que no son la  vagina, estos a menudo se protruyen hacia adentro o hacia afuera de la vagina, dependiendo de la gravedad del prolapso. CAUSAS Algunas causas de esta enfermedad incluyen lo siguiente:  Krystal Clarkmbarazo, trabajo de parto y [place]parto.  Tos prolongada (crnica).  Estreimiento crnico.  Obesidad.  Ciruga de pelvis anterior.  Envejecimiento. Durante y despus de la menopausia, una disminucin en la produccin de Cabin crewestrgenos puede debilitar los msculos y los ligamentos plvicos.  Levantar sistemticamente objetos pesados de ms de 50libras (23kg).  Acumulacin de lquido en el abdomen debido a ciertas enfermedades y a Lexicographerotras afecciones. SNTOMAS Los sntomas de esta afeccin incluyen lo siguiente:  Prdida del control de la vejiga al toser, Engineering geologistestornudar, hacer un esfuerzo y Radio producerhacer ejercicio (incontinencia urinaria de esfuerzo). Esto puede empeorar inmediatamente despus del nacimiento y Scientist, clinical (histocompatibility and immunogenetics)mejorar gradualmente con Museum/gallery conservatorel tiempo.  Sensacin de presin en la pelvis o la vagina. Esta presin puede aumentar al toser o defecar.  Una protuberancia que sobresale desde la apertura de la vagina o contra la pared vaginal. Si el tero sobresale a travs de la apertura de la vagina y roza la ropa, tambin puede padecer irritacin, lceras, infeccin, dolor y Nortonvillesangrado.  Mayor esfuerzo para Magazine features editordefecar u orinar.  Dolor en la parte inferior de la espalda.  Dolor, molestias o Diplomatic Services operational officerdesinters en las relaciones sexuales.  Infecciones reiteradas en la vejiga (infecciones en las vas urinarias).  Dificultad o incapacidad para colocar un tampn o un aplicador. En algunas personas, esta afeccin no produce sntomas. DIAGNSTICO El mdico puede realizarle un examen interno y externo de la vagina y el recto. Durante el examen, puede pedirle que tosa y que haga un esfuerzo mientras est [place]ombeacostada, sentada y de pie. El mdico determinar si se requieren ms estudios, como las pruebas de la funcin de la vejiga. TRATAMIENTO En la  International Business Machinesmayora de los casos, esta afeccin debe tratarse solo si produce sntomas. No existe ningn tratamiento que garantice que el prolapso se corregir o que alivie los sntomas por completo. El tratamiento puede incluir lo siguiente:  Cambios de estilo de vida tales como:  Automotive engineervitar las bebidas que contengan cafena.  Aumentar la ingesta de alimentos con alto contenido de [place]fibra. Esto puede ayudar a Engineer, manufacturing systemsdisminuir el estreimiento y Psychologist, educationalel esfuerzo al defecar.  Vaciar la vejiga en momentos programados (terapia de entrenamiento de la vejiga). Esto puede ayudar a reducir o Transport plannerevitar la incontinencia urinaria.  Bajar de peso si tiene sobrepeso o es obeso.  Estrgeno. Si el prolapso es leve, el estrgeno puede ayudar al aumentar la fuerza y el tono de los msculos del piso plvico.  Ejercicios de Kegel. Estos ejercicios pueden ayudar en los casos leves de prolapso al estirar y tensar los msculos del piso plvico.  Colocacin de un pesario. Un pesario es un dispositivo blando y flexible que el mdico coloca en la vagina para ayudar a Occupational psychologistsostener las paredes vaginales y Pharmacologistmantener los rganos de la pelvis en su Environmental consultantlugar.  Ciruga. Esta es a menudo la nica forma de tratamiento de los casos graves de prolapso. Existen diferentes tipos de Azerba[place]anciruga disponibles. INSTRUCCIONES PARA EL CUIDADO EN EL HOGAR  Use una toalla higinica o un producto absorbente si tiene incontinencia urinaria.  Evite levantar objetos pesados y Comptrollerrealizar esfuerzos mientras se ejercita o trabaja. No contenga la respiracin cuando levanta pesas y realiza ejercicios de intensidad leve a moderada. Limite sus actividades segn las indicaciones del mdico.  Tome los medicamentos solamente como se lo haya indicado el mdico.  Practique  los ejercicios de Kegel como se lo haya indicado el mdico.  Si tiene un pesario, selo como se lo haya indicado el mdico. SOLICITE ATENCIN MDICA SI:  Sus sntomas interfieren con sus actividades diarias o su vida  sexual.  Necesita medicamentos para Paramedic las [place].  Observa sangrado vaginal no relacionado con su menstruacin.  Tiene fiebre.  Siente dolor o tiene una hemorragia al [employer].  Observa sangrado al defecar.  Pierde orina durante las [employer].  Sufre estreimiento crnico.  Tiene un pesario colocado y Associate Professor.  Tiene secrecin vaginal con olor ftido.  Tiene dolor o clicos inusuales en la parte inferior del abdomen. Esta informacin no tiene Theme park manager el consejo del mdico. Asegrese de hacerle al mdico cualquier pregunta que tenga. Document Released: 08/19/2013 Document Revised: 08/19/2013 Document Reviewed: 04/06/2013 Elsevier Interactive Patient Education  2017 [employer].

## 2016-04-11 NOTE — Progress Notes (Signed)
Pamela AllanOlivia N Gibbs Jun 21, 1980 [id number]   History:    36 y.o.  for annual gyn exam with no complaints today. Although patient has seen in the past back in 2016 with a complaint of bloating sensation which she has learned to get over with and manage. She does have a first-degree uterine prolapse no cystocele no rectocele no urinary incontinence. She was offered pessary or surgery back then and still not interested. She does a lot of lifting at her work. Back in 2016 she was treated for H. pylori and then was followed by the gastrologist where she had an endoscopy. She does suffer from gastroesophageal reflux and had been on Prilosec and hasn't been off of it for some time now and would like to pursue she refill. Patient with no past history of abnormal Pap smears.  Past medical history,surgical history, family history and social history were all reviewed and documented in the EPIC chart.  Gynecologic History Patient's last menstrual period was 03/28/2016 (approximate). Contraception: tubal ligation Last Pap: 2016. Results were: normal Last mammogram: Not indicated. Results were: Not indicated  Obstetric History OB History  Gravida Para Term Preterm AB Living  3 3       3   SAB TAB Ectopic Multiple Live Births               # Outcome Date GA Lbr Len/2nd Weight Sex Delivery Anes PTL Lv  3 Para           2 Para           1 Para                ROS: A ROS was performed and pertinent positives and negatives are included in the history.  GENERAL: No fevers or chills. HEENT: No change in vision, no earache, sore throat or sinus congestion. NECK: No pain or stiffness. CARDIOVASCULAR: No chest pain or pressure. No palpitations. PULMONARY: No shortness of breath, cough or wheeze. GASTROINTESTINAL: No abdominal pain, nausea, vomiting or diarrhea, melena or bright red blood per rectum. GENITOURINARY: No urinary frequency, urgency, hesitancy or dysuria. MUSCULOSKELETAL: No joint or muscle pain, no  back pain, no recent trauma. DERMATOLOGIC: No rash, no itching, no lesions. ENDOCRINE: No polyuria, polydipsia, no heat or cold intolerance. No recent change in weight. HEMATOLOGICAL: No anemia or easy bruising or bleeding. NEUROLOGIC: No headache, seizures, numbness, tingling or weakness. PSYCHIATRIC: No depression, no loss of interest in normal activity or change in sleep pattern.     Exam: chaperone present  BP 120/78   Ht 5\' 3"  (1.6 m)   Wt 150 lb (68 kg)   LMP 03/28/2016 (Approximate)   BMI 26.57 kg/m   Body mass index is 26.57 kg/m.  General appearance : Well developed well nourished female. No acute distress HEENT: Eyes: no retinal hemorrhage or exudates,  Neck supple, trachea midline, no carotid bruits, no thyroidmegaly Lungs: Clear to auscultation, no rhonchi or wheezes, or rib retractions  Heart: Regular rate and rhythm, no murmurs or gallops Breast:Examined in sitting and supine position were symmetrical in appearance, no palpable masses or tenderness,  no skin retraction, no nipple inversion, no nipple discharge, no skin discoloration, no axillary or supraclavicular lymphadenopathy Abdomen: no palpable masses or tenderness, no rebound or guarding Extremities: no edema or skin discoloration or tenderness  Pelvic:  B[place]olin, Urethra, Skene Glands: Within normal limits             Vagina: No gross lesions or discharge  Cervix: No gross lesions or discharge  Uterus  no cystocele or rectocele noted on Valsalva maneuver her cervix came to the area of the introitus  Adnexa  Without masses or tenderness  Anus and perineum  normal   Rectovaginal/ sphincter tone without palpated masses or tenderness             Hemoccult not indicated     Assessment/Plan:  36 y.o. female for annual exam with pelvic organ prolapse no cystocele, rectocele, no evidence of stress urinary incontinence. Patient was shown pictures and pamphlets provided in reference to pelvic organ prolapse. We  discussed treatment options to include transvaginal hysterectomy or temporary relief with pessary. Literature information was also presented in Spanish patient will review it and decide in the near future. A CBC, screening cholesterol and comprehensive metabolic panel was obtained today. Pap smear not indicated.   Ok Edwards MD, 3:30 PM 04/11/2016

## 2016-04-12 ENCOUNTER — Encounter: Payer: Self-pay | Admitting: Gynecology

## 2016-04-27 IMAGING — US US ABDOMEN COMPLETE
1 series · 14 of 25 positions shown · non-contrast
Comparison: None.

CLINICAL DATA: Mid epigastric pain.

EXAM:
ULTRASOUND ABDOMEN COMPLETE

[Series 1: us abdomen complete · 14 of 111 slices shown]
[im 1/111]
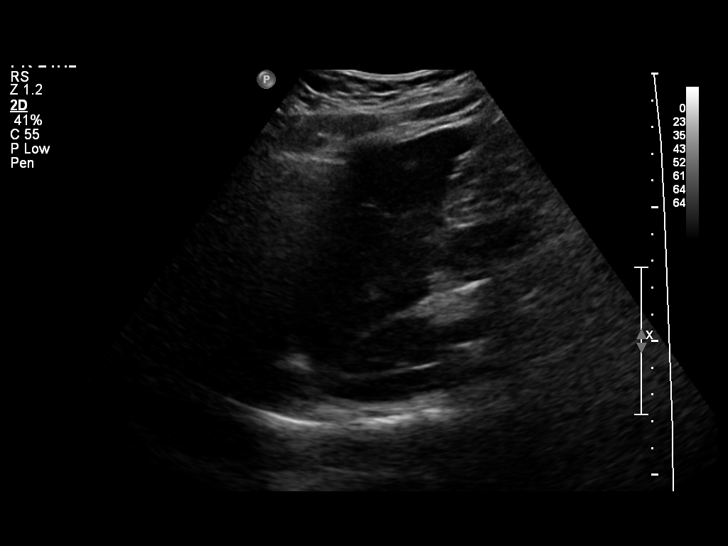
[im 10/111]
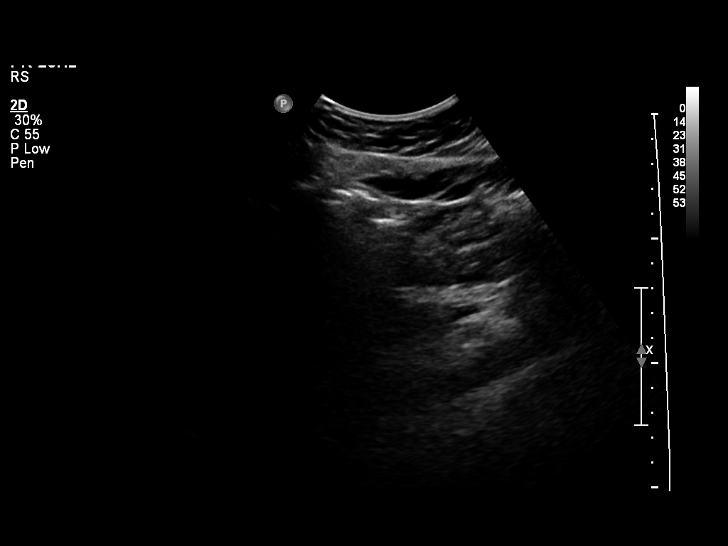
[im 19/111]
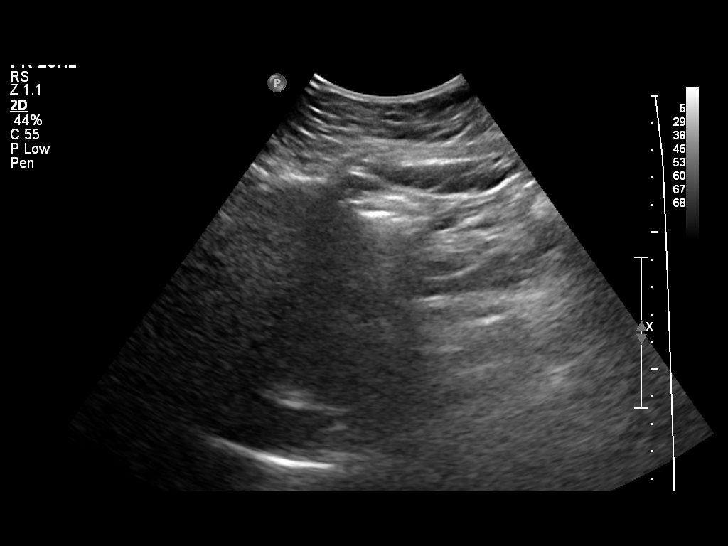
[im 28/111]
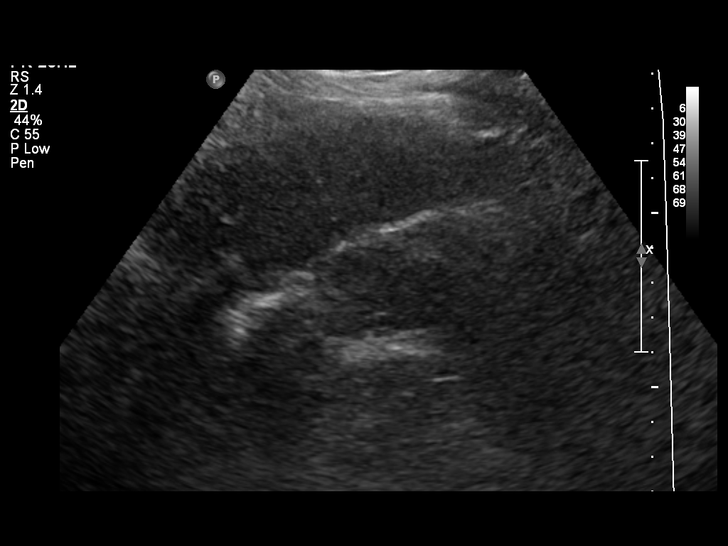
[im 37/111]
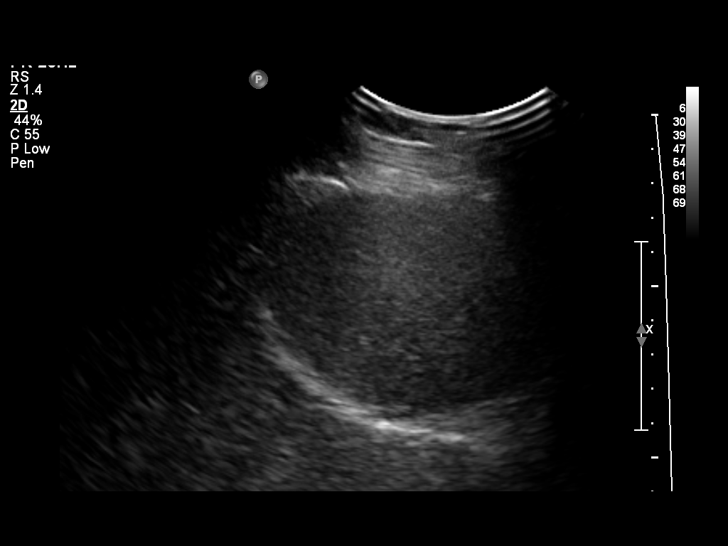
[im 42/111]
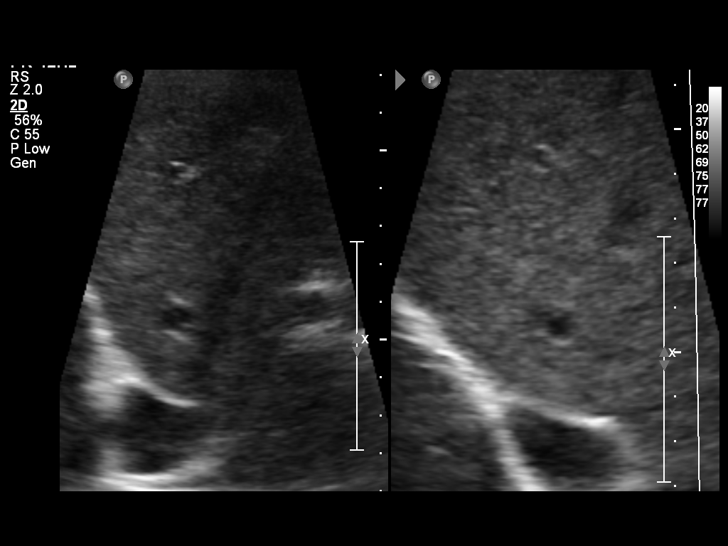
[im 51/111]
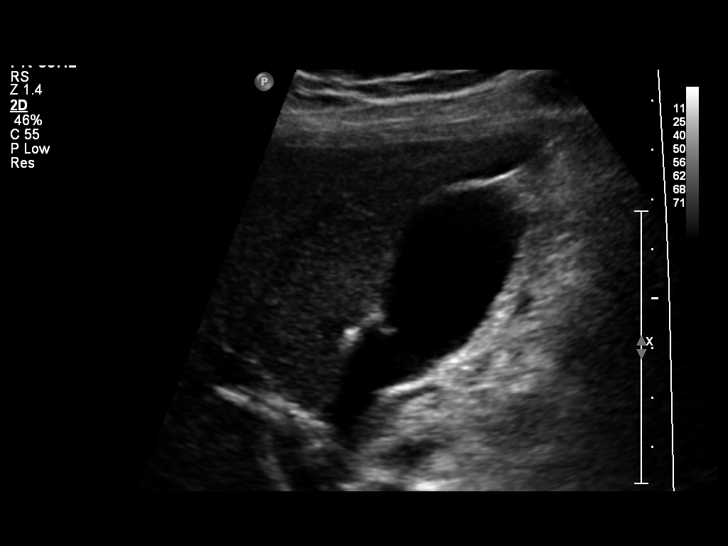
[im 60/111]
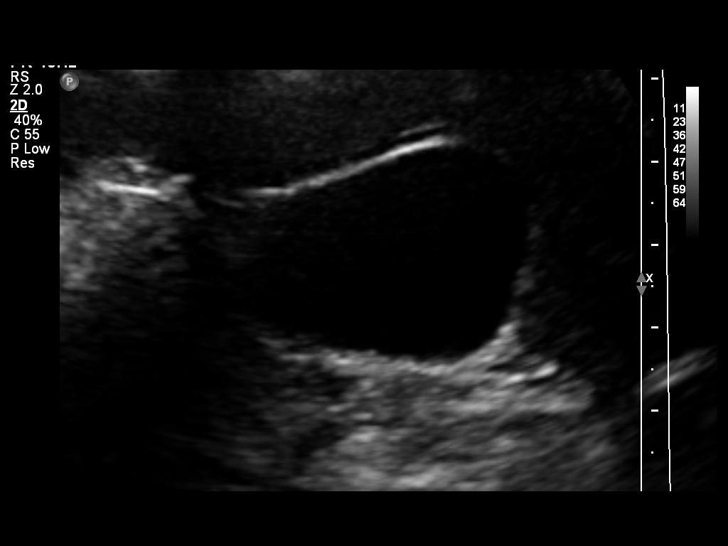
[im 69/111]
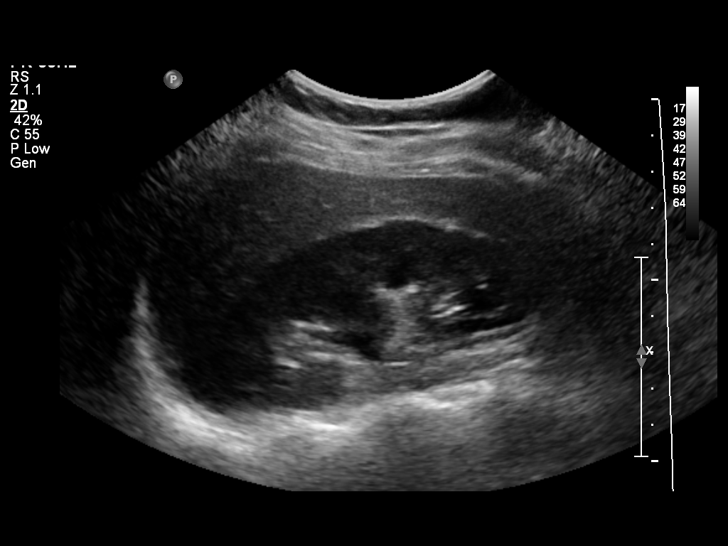
[im 74/111]
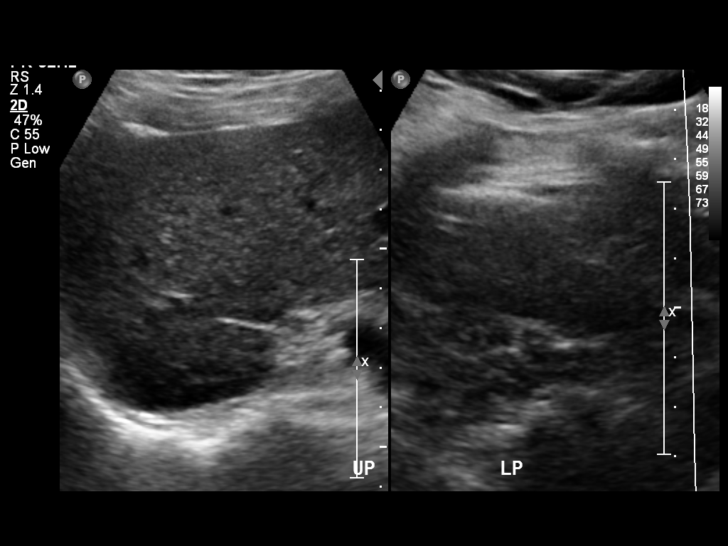
[im 83/111]
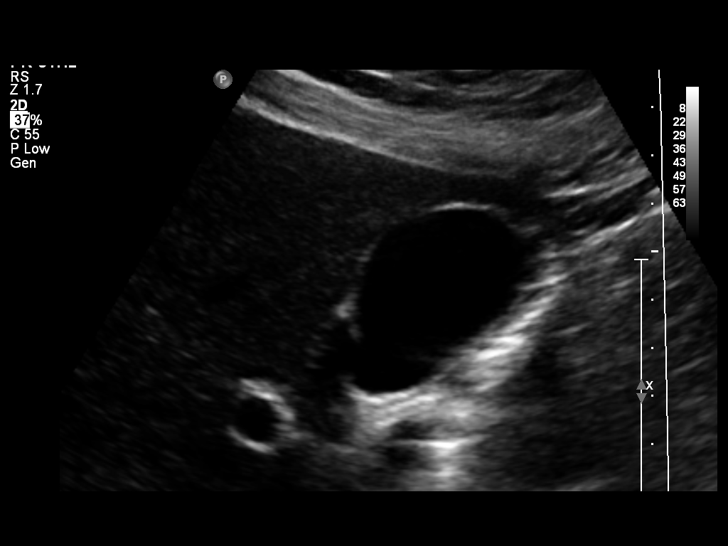
[im 92/111]
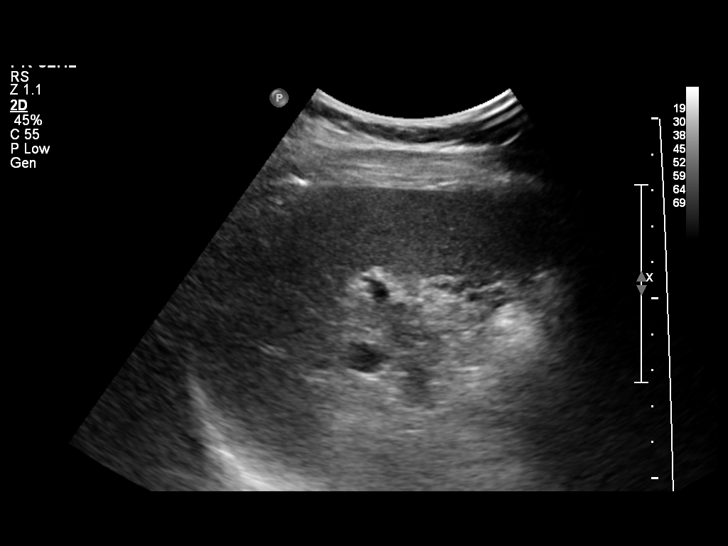
[im 101/111]
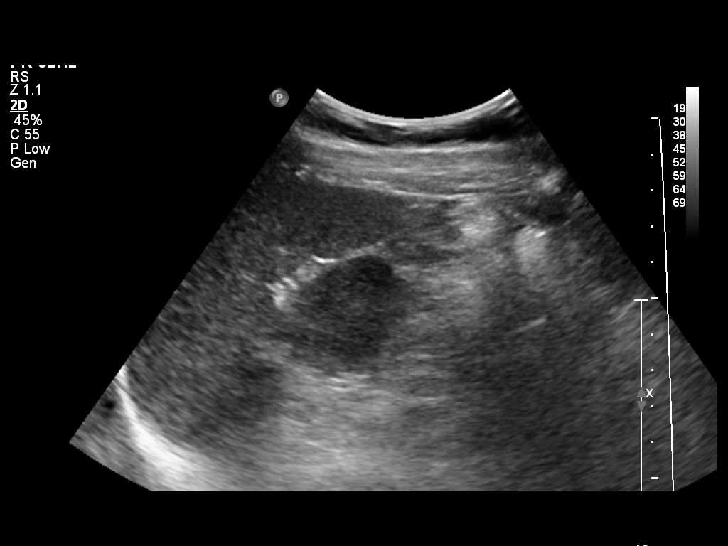
[im 111/111]
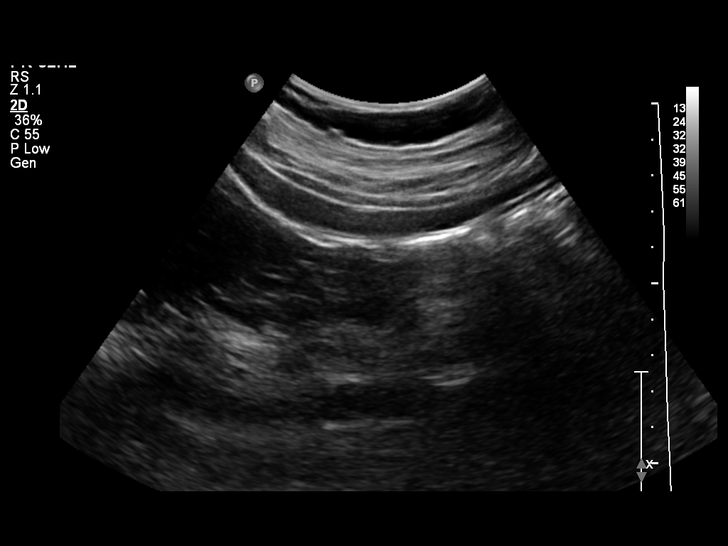

[14 of 25 positions shown; findings below may reference images not displayed]

FINDINGS: Gallbladder: No gallstones or wall thickening visualized. No
sonographic Murphy sign noted.

Common bile duct: Diameter: 2.7 mm

Liver: There are 2 small cysts noted which measure up to 9 mm.

IVC: No abnormality visualized.

Pancreas: Visualized portion unremarkable.

Spleen: Size and appearance within normal limits.

Right Kidney: Length: 10 cm. Echogenicity within normal limits. No
mass or hydronephrosis visualized.

Left Kidney: Length: 10.2 cm. Echogenicity within normal limits. No
mass or hydronephrosis visualized.

Abdominal aorta:  Measures 2.5 cm.

Other findings: None.
IMPRESSION: 1. No acute findings.
2. Liver cyst.
3. Abdominal aorta ectasia. Ectatic abdominal aorta at risk for
aneurysm development. Recommend followup by ultrasound in 5 years.
This recommendation follows ACR consensus guidelines: White Paper of
the ACR Incidental Findings Committee II on Vascular Findings. [HOSPITAL] 8767; [DATE].

## 2016-06-20 ENCOUNTER — Encounter: Payer: Self-pay | Admitting: Gynecology
